# Patient Record
Sex: Male | Born: 1975 | Race: White | Hispanic: No | Marital: Single | Smoking: Former smoker
Health system: Southern US, Community
[De-identification: ages and names within clinical notes are randomized; demographics above are authoritative.]

## PROBLEM LIST (undated history)

## (undated) HISTORY — PX: TONSILLECTOMY: SUR1361

---

## 2021-04-05 ENCOUNTER — Encounter (HOSPITAL_COMMUNITY): Payer: Self-pay | Admitting: Emergency Medicine

## 2021-04-05 ENCOUNTER — Emergency Department (HOSPITAL_COMMUNITY)
Admission: EM | Admit: 2021-04-05 | Discharge: 2021-04-05 | Disposition: A | Discharge status: Home / Self Care | Attending: Emergency Medicine | Admitting: Emergency Medicine

## 2021-04-05 ENCOUNTER — Emergency Department (HOSPITAL_COMMUNITY)

## 2021-04-05 ENCOUNTER — Other Ambulatory Visit: Payer: Self-pay

## 2021-04-05 DIAGNOSIS — Z87891 Personal history of nicotine dependence: Secondary | ICD-10-CM | POA: Insufficient documentation

## 2021-04-05 DIAGNOSIS — S62324A Displaced fracture of shaft of fourth metacarpal bone, right hand, initial encounter for closed fracture: Secondary | ICD-10-CM | POA: Insufficient documentation

## 2021-04-05 DIAGNOSIS — S62241A Displaced fracture of shaft of first metacarpal bone, right hand, initial encounter for closed fracture: Secondary | ICD-10-CM | POA: Insufficient documentation

## 2021-04-05 DIAGNOSIS — Z23 Encounter for immunization: Secondary | ICD-10-CM | POA: Insufficient documentation

## 2021-04-05 DIAGNOSIS — S6991XA Unspecified injury of right wrist, hand and finger(s), initial encounter: Secondary | ICD-10-CM | POA: Diagnosis present

## 2021-04-05 MED ORDER — IBUPROFEN 400 MG PO TABS
400.0000 mg | ORAL_TABLET | Freq: Once | ORAL | Status: AC
  Administered 2021-04-05: 400 mg via ORAL
  Filled 2021-04-05: qty 1

## 2021-04-05 MED ORDER — AMOXICILLIN-POT CLAVULANATE 875-125 MG PO TABS: 1.0000 | ORAL_TABLET | Freq: Two times a day (BID) | ORAL | 0 refills | Status: DC

## 2021-04-05 MED ORDER — TETANUS-DIPHTH-ACELL PERTUSSIS 5-2.5-18.5 LF-MCG/0.5 IM SUSY
0.5000 mL | PREFILLED_SYRINGE | Freq: Once | INTRAMUSCULAR | Status: AC
  Administered 2021-04-05: 0.5 mL via INTRAMUSCULAR
  Filled 2021-04-05: qty 0.5

## 2021-04-05 MED ORDER — HYDROCODONE-ACETAMINOPHEN 5-325 MG PO TABS: 1.0000 | ORAL_TABLET | ORAL | 0 refills | Status: DC | PRN

## 2021-04-05 MED ORDER — HYDROCODONE-ACETAMINOPHEN 5-325 MG PO TABS
1.0000 | ORAL_TABLET | Freq: Once | ORAL | Status: AC
  Administered 2021-04-05: 1 via ORAL
  Filled 2021-04-05: qty 1

## 2021-04-05 MED ORDER — AMOXICILLIN-POT CLAVULANATE 875-125 MG PO TABS
1.0000 | ORAL_TABLET | Freq: Once | ORAL | Status: AC
  Administered 2021-04-05: 1 via ORAL
  Filled 2021-04-05: qty 1

## 2021-04-05 NOTE — Discharge Instructions (Signed)
Keep the splint in place, keep arm elevated.  Follow-up with Dr. Romeo Apple for evaluation of your fractures which are likely to need surgery.  Take the antibiotics as prescribed.  Return to the ED worsening pain, weakness, numbness, tingling, other concerns
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
## 2021-04-05 NOTE — ED Notes (Signed)
Pt provided ice pack to apply to right hand

## 2021-04-05 NOTE — ED Notes (Signed)
ED Provider at bedside. 
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
## 2021-04-05 NOTE — ED Notes (Signed)
X-ray at bedside

## 2021-04-14 ENCOUNTER — Ambulatory Visit (INDEPENDENT_AMBULATORY_CARE_PROVIDER_SITE_OTHER): Admitting: Orthopedic Surgery

## 2021-04-14 ENCOUNTER — Encounter: Payer: Self-pay | Admitting: Orthopedic Surgery

## 2021-04-14 ENCOUNTER — Other Ambulatory Visit: Payer: Self-pay

## 2021-04-14 VITALS — BP 136/116 | HR 60 | Ht 69.0 in | Wt 170.0 lb

## 2021-04-14 DIAGNOSIS — S62344A Nondisplaced fracture of base of fourth metacarpal bone, right hand, initial encounter for closed fracture: Secondary | ICD-10-CM | POA: Diagnosis not present

## 2021-04-14 DIAGNOSIS — S62244A Nondisplaced fracture of shaft of first metacarpal bone, right hand, initial encounter for closed fracture: Secondary | ICD-10-CM | POA: Diagnosis not present
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
## 2021-05-14 DIAGNOSIS — S62344D Nondisplaced fracture of base of fourth metacarpal bone, right hand, subsequent encounter for fracture with routine healing: Secondary | ICD-10-CM | POA: Insufficient documentation

## 2021-05-14 DIAGNOSIS — S62244D Nondisplaced fracture of shaft of first metacarpal bone, right hand, subsequent encounter for fracture with routine healing: Secondary | ICD-10-CM | POA: Insufficient documentation

## 2021-05-15 ENCOUNTER — Encounter: Admitting: Orthopedic Surgery

## 2021-05-15 ENCOUNTER — Telehealth: Payer: Self-pay | Admitting: Orthopedic Surgery

## 2021-05-15 NOTE — Telephone Encounter (Signed)
Called [facility] regarding appointment which was scheduled for this morning. Spoke with medical department - nurse Renae Fickle - relays patient has been transferred to [facility], [place]; states notes show that  facility is having pt seen in [place].
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
